# Patient Record
Sex: Female | Born: 1980 | Race: Black or African American | Hispanic: No | Marital: Single | Smoking: Current every day smoker
Health system: Southern US, Community
[De-identification: ages and names within clinical notes are randomized; demographics above are authoritative.]

## PROBLEM LIST (undated history)

## (undated) DIAGNOSIS — I10 Essential (primary) hypertension: Secondary | ICD-10-CM

---

## 2004-01-11 ENCOUNTER — Emergency Department (HOSPITAL_COMMUNITY): Admission: EM | Admit: 2004-01-11 | Discharge: 2004-01-11 | Payer: Self-pay | Admitting: Emergency Medicine

## 2006-04-23 ENCOUNTER — Emergency Department (HOSPITAL_COMMUNITY): Admission: EM | Admit: 2006-04-23 | Discharge: 2006-04-23 | Payer: Self-pay | Admitting: Emergency Medicine

## 2015-07-10 ENCOUNTER — Emergency Department (HOSPITAL_COMMUNITY)
Admission: EM | Admit: 2015-07-10 | Discharge: 2015-07-10 | Disposition: A | Discharge status: Home / Self Care | Payer: No Typology Code available for payment source | Attending: Emergency Medicine | Admitting: Emergency Medicine

## 2015-07-10 ENCOUNTER — Encounter (HOSPITAL_COMMUNITY): Payer: Self-pay

## 2015-07-10 DIAGNOSIS — S46811A Strain of other muscles, fascia and tendons at shoulder and upper arm level, right arm, initial encounter: Secondary | ICD-10-CM | POA: Insufficient documentation

## 2015-07-10 DIAGNOSIS — Y999 Unspecified external cause status: Secondary | ICD-10-CM | POA: Insufficient documentation

## 2015-07-10 DIAGNOSIS — Y929 Unspecified place or not applicable: Secondary | ICD-10-CM | POA: Diagnosis not present

## 2015-07-10 DIAGNOSIS — Y939 Activity, unspecified: Secondary | ICD-10-CM | POA: Diagnosis not present

## 2015-07-10 DIAGNOSIS — S4991XA Unspecified injury of right shoulder and upper arm, initial encounter: Secondary | ICD-10-CM | POA: Diagnosis present

## 2015-07-10 MED ORDER — DICLOFENAC SODIUM 75 MG PO TBEC
75.0000 mg | DELAYED_RELEASE_TABLET | Freq: Two times a day (BID) | ORAL | Status: DC
Start: 2015-07-10 — End: 2017-05-19

## 2015-07-10 MED ORDER — METHOCARBAMOL 500 MG PO TABS: 500.0000 mg | ORAL_TABLET | Freq: Three times a day (TID) | ORAL | Status: DC

## 2015-07-10 MED ORDER — ACETAMINOPHEN-CODEINE #3 300-30 MG PO TABS: 1.0000 | ORAL_TABLET | Freq: Four times a day (QID) | ORAL | Status: DC | PRN
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
## 2015-07-15 ENCOUNTER — Ambulatory Visit: Payer: Self-pay | Admitting: Orthopaedic Surgery

## 2015-07-16 ENCOUNTER — Encounter: Payer: Self-pay | Admitting: Orthopaedic Surgery

## 2015-09-25 ENCOUNTER — Emergency Department (HOSPITAL_COMMUNITY)
Admission: EM | Admit: 2015-09-25 | Discharge: 2015-09-25 | Disposition: A | Discharge status: Home / Self Care | Payer: Self-pay | Attending: Emergency Medicine | Admitting: Emergency Medicine

## 2015-09-25 ENCOUNTER — Encounter (HOSPITAL_COMMUNITY): Payer: Self-pay | Admitting: Emergency Medicine

## 2015-09-25 ENCOUNTER — Emergency Department (HOSPITAL_COMMUNITY): Payer: Self-pay

## 2015-09-25 DIAGNOSIS — M25572 Pain in left ankle and joints of left foot: Secondary | ICD-10-CM | POA: Insufficient documentation

## 2015-09-25 DIAGNOSIS — Z791 Long term (current) use of non-steroidal anti-inflammatories (NSAID): Secondary | ICD-10-CM | POA: Insufficient documentation

## 2015-09-25 MED ORDER — NAPROXEN 250 MG PO TABS: 250.0000 mg | ORAL_TABLET | Freq: Two times a day (BID) | ORAL | Status: DC | PRN

## 2015-09-25 NOTE — Discharge Instructions (Signed)
Take the prescription as directed.  Apply moist heat or ice to the area(s) of discomfort, for 15 minutes at a time, several times per day for the next few days.  Do not fall asleep on a heating or ice pack. Elevate your ankle and wear the ankle support until you are seen in follow up.  Call your regular medical doctor and the Orthopedic doctor today to schedule a follow up appointment next week.  Return to the Emergency Department immediately if worsening.

## 2015-09-25 NOTE — ED Provider Notes (Signed)
CSN: [id number]     Arrival date & time 09/25/15  1013 History   First MD Initiated Contact with Patient 09/25/15 1029     Chief Complaint  Patient presents with  . Joint Swelling    left ankle      HPI  Pt was seen at 1030. Per pt, c/o gradual onset and persistence of waxing and waning left ankle and LLE "swelling" for the past 3 months. Pt states the area "swells when I stand on it too long." Denies injury, no focal motor weakness, no tingling/numbness in extremities, no fevers, no rash, no other joints pain.   History reviewed. No pertinent past medical history.   History reviewed. No pertinent past surgical history.  Social History  Substance Use Topics  . Smoking status: Never Smoker   . Smokeless tobacco: None  . Alcohol Use: No    Review of Systems ROS: Statement: All systems negative except as marked or noted in the HPI; Constitutional: Negative for fever and chills. ; ; Eyes: Negative for eye pain, redness and discharge. ; ; ENMT: Negative for ear pain, hoarseness, nasal congestion, sinus pressure and sore throat. ; ; Cardiovascular: Negative for chest pain, palpitations, diaphoresis, dyspnea and peripheral edema. ; ; Respiratory: Negative for cough, wheezing and stridor. ; ; Gastrointestinal: Negative for nausea, vomiting, diarrhea, abdominal pain, blood in stool, hematemesis, jaundice and rectal bleeding. . ; ; Genitourinary: Negative for dysuria, flank pain and hematuria. ; ; Musculoskeletal: +left ankle swelling. Negative for back pain and neck pain. Negative for deformity and trauma.; ; Skin: Negative for pruritus, rash, abrasions, blisters, bruising and skin lesion.; ; Neuro: Negative for headache, lightheadedness and neck stiffness. Negative for weakness, altered level of consciousness, altered mental status, extremity weakness, paresthesias, involuntary movement, seizure and syncope.      Allergies  Review of patient's allergies indicates no known allergies.  Home  Medications   Prior to Admission medications   Medication Sig Start Date End Date Taking? Authorizing Provider  ibuprofen (ADVIL,MOTRIN) 200 MG tablet Take 800-1,000 mg by mouth every 6 (six) hours as needed for mild pain.   Yes Historical Provider, MD  acetaminophen-codeine (TYLENOL #3) 300-30 MG tablet Take 1-2 tablets by mouth every 6 (six) hours as needed for moderate pain. Patient not taking: Reported on 09/25/2015 07/10/15   Ivery Quale, PA-C  diclofenac (VOLTAREN) 75 MG EC tablet Take 1 tablet (75 mg total) by mouth 2 (two) times daily. Patient not taking: Reported on 09/25/2015 07/10/15   Ivery Quale, PA-C  methocarbamol (ROBAXIN) 500 MG tablet Take 1 tablet (500 mg total) by mouth 3 (three) times daily. Patient not taking: Reported on 09/25/2015 07/10/15   Ivery Quale, PA-C   Pulse 91  Temp(Src) 98.2 F (36.8 C) (Oral)  Resp 16  Ht  (1.549 m)  Wt 197 lb (89.359 kg)  BMI 37.24 kg/m2  SpO2 96%  LMP 09/11/2015 Physical Exam  1035: Physical examination:  Nursing notes reviewed; Vital signs and O2 SAT reviewed;  Constitutional: Well developed, Well nourished, Well hydrated, In no acute distress; Head:  Normocephalic, atraumatic; Eyes: EOMI, PERRL, No scleral icterus; ENMT: Mouth and pharynx normal, Mucous membranes moist; Neck: Supple, Full range of motion, No lymphadenopathy; Cardiovascular: Regular rate and rhythm, No murmur, rub, or gallop; Respiratory: Breath sounds clear & equal bilaterally, No rales, rhonchi, wheezes.  Speaking full sentences with ease, Normal respiratory effort/excursion; Chest: Nontender, Movement normal; Abdomen: Soft, Nontender, Nondistended, Normal bowel sounds; Genitourinary: No CVA tenderness; Extremities: Pulses normal,  NT left knee/ankle/foot. NMS intact left foot, strong pedal pp, LE muscle compartments soft.  No left proximal fibular head tenderness.  No deformity, no ecchymosis, no edema, no open wounds.  +plantarflexion of left foot w/calf squeeze.   No palpable gap left. Achilles's tendon. No calf tenderness, edema or asymmetry.; Neuro: AA&Ox3, Major CN grossly intact.  Speech clear. No gross focal motor or sensory deficits in extremities.; Skin: Color normal, Warm, Dry.   ED Course  Procedures (including critical care time) Labs Review  Imaging Review  I have personally reviewed and evaluated these images and lab results as part of my medical decision-making.   EKG Interpretation None      MDM  MDM Reviewed: previous chart, nursing note and vitals Interpretation: ultrasound and x-ray     Dg Ankle Complete Left 09/25/2015  CLINICAL DATA:  Pain and swelling for 3 months EXAM: LEFT ANKLE COMPLETE - 3+ VIEW COMPARISON:  None. FINDINGS: Frontal, oblique, and lateral views were obtained. There is generalized soft tissue swelling. There is no fracture or joint effusion. The ankle mortise appears intact. No erosive change. IMPRESSION: Generalized soft tissue swelling. No fracture or appear arthropathy. Ankle mortise appears intact. Electronically Signed   By: Bretta BangWilliam  Woodruff III M.D.   On: 09/25/2015 11:17   [place] Venous Img Lower Unilateral Left 09/25/2015  CLINICAL DATA:  Left ankle pain and swelling for the past 3 months. Evaluate for DVT. EXAM: LEFT LOWER EXTREMITY VENOUS DOPPLER ULTRASOUND TECHNIQUE: Gray-scale sonography with graded compression, as well as color Doppler and duplex ultrasound were performed to evaluate the lower extremity deep venous systems from the level of the common femoral vein and including the common femoral, femoral, profunda femoral, popliteal and calf veins including the posterior tibial, peroneal and gastrocnemius veins when visible. The superficial great saphenous vein was also interrogated. Spectral Doppler was utilized to evaluate flow at rest and with distal augmentation maneuvers in the common femoral, femoral and popliteal veins. COMPARISON:  None. FINDINGS: Contralateral Common Femoral Vein: Respiratory  phasicity is normal and symmetric with the symptomatic side. No evidence of thrombus. Normal compressibility. Common Femoral Vein: No evidence of thrombus. Normal compressibility, respiratory phasicity and response to augmentation. Saphenofemoral Junction: No evidence of thrombus. Normal compressibility and flow on color Doppler imaging. Profunda Femoral Vein: No evidence of thrombus. Normal compressibility and flow on color Doppler imaging. Femoral Vein: No evidence of thrombus. Normal compressibility, respiratory phasicity and response to augmentation. Popliteal Vein: No evidence of thrombus. Normal compressibility, respiratory phasicity and response to augmentation. Calf Veins: No evidence of thrombus. Normal compressibility and flow on color Doppler imaging. Superficial Great Saphenous Vein: No evidence of thrombus. Normal compressibility and flow on color Doppler imaging. Venous Reflux:  None. Other Findings:  None. IMPRESSION: No evidence of DVT within the left lower extremity. Electronically Signed   By: Simonne ComeJohn  Watts M.D.   On: 09/25/2015 11:46    1225:  Vasc US and XR reassuring. Tx symptomatically, f/u Ortho MD. Dx and testing d/w pt.  Questions answered.  Verb understanding, agreeable to d/c home with outpt f/u.   Samuel JesterKathleen Lavern Maslow, DO 09/27/15 [phone]

## 2015-09-25 NOTE — ED Notes (Signed)
Swelling to left ankle for last 3 months.  Rates pain 8/10. Denies injury.

## 2017-02-17 IMAGING — US US EXTREM LOW VENOUS*L*
1 series · 13 of 24 positions shown · non-contrast
Comparison: None.

CLINICAL DATA: Left ankle pain and swelling for the past 3 months.
Evaluate for DVT.



[Series 1: us extrem low venous*left* · 0.08mm/px · 13 of 33 slices shown]
[im 1/33]
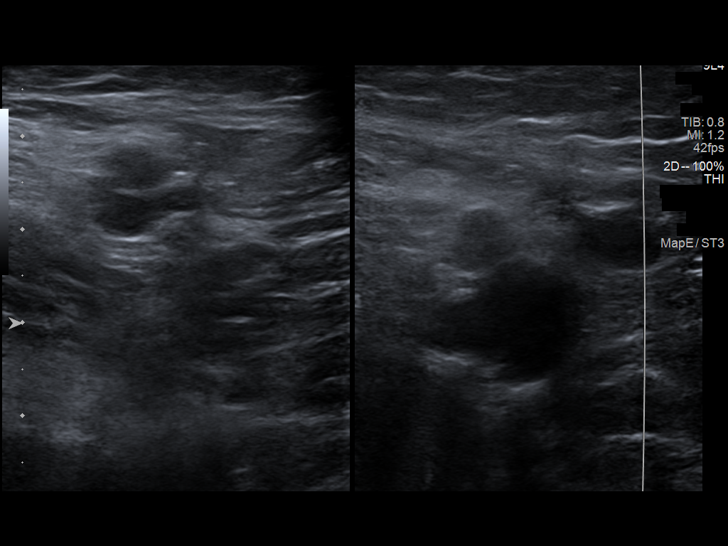
[im 3/33]
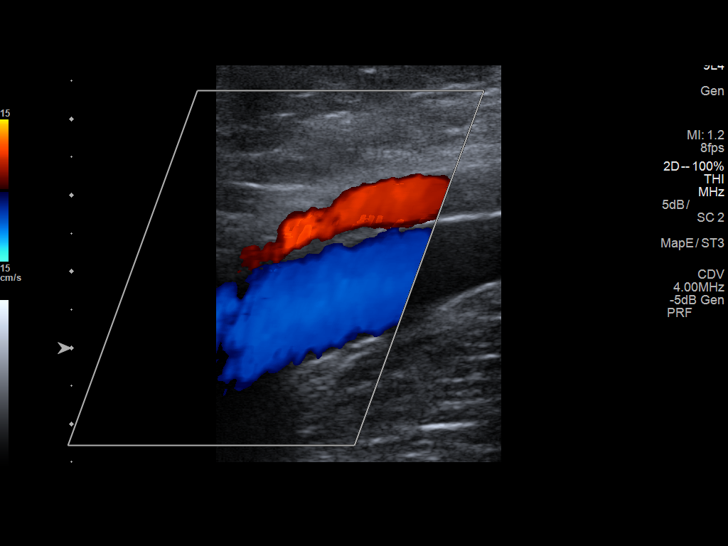
[im 6/33]
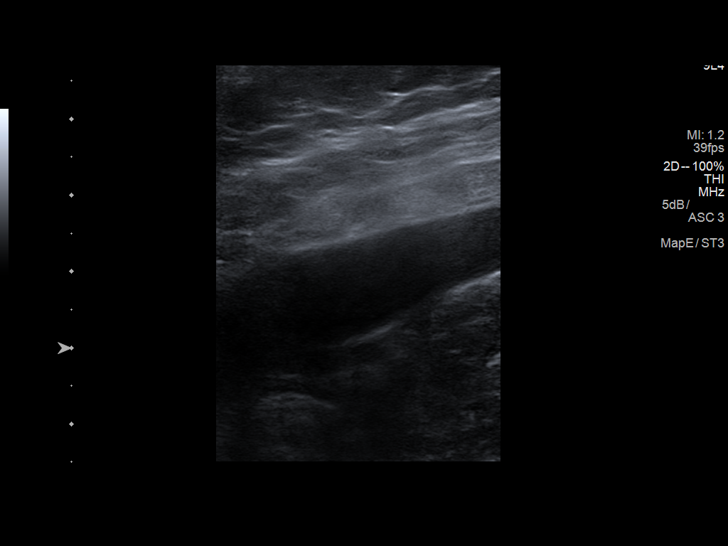
[im 9/33]
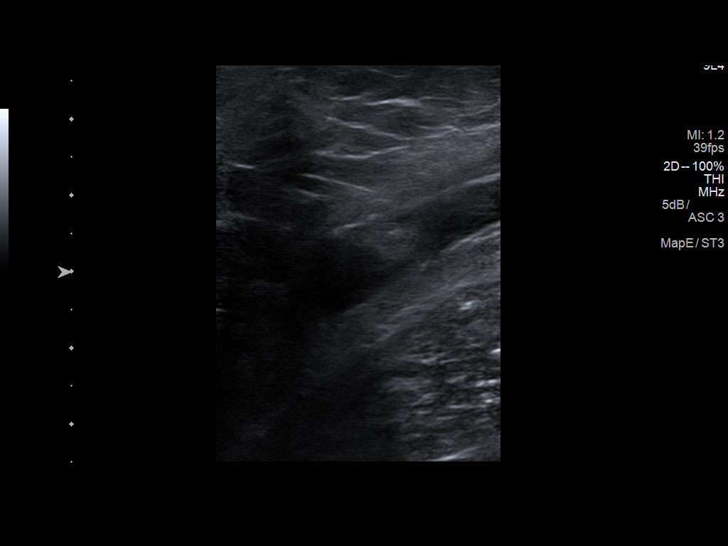
[im 12/33]
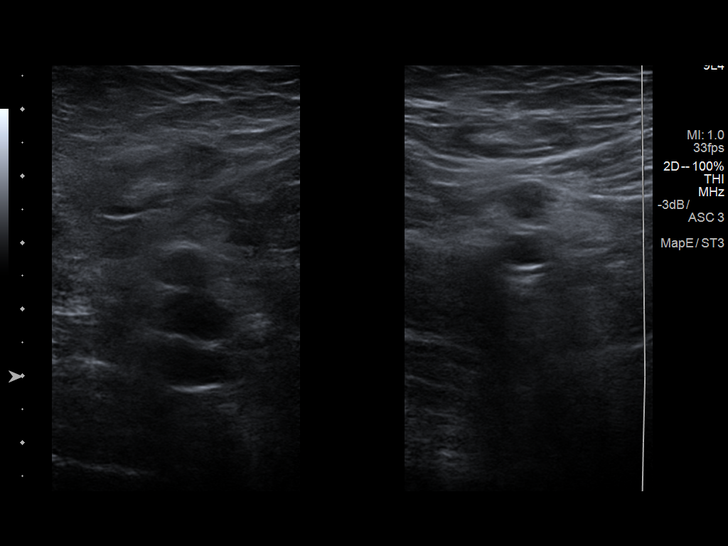
[im 14/33]
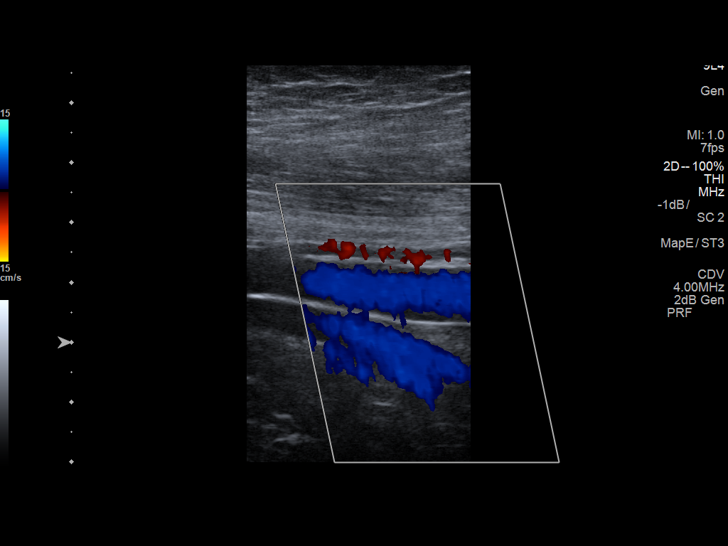
[im 17/33]
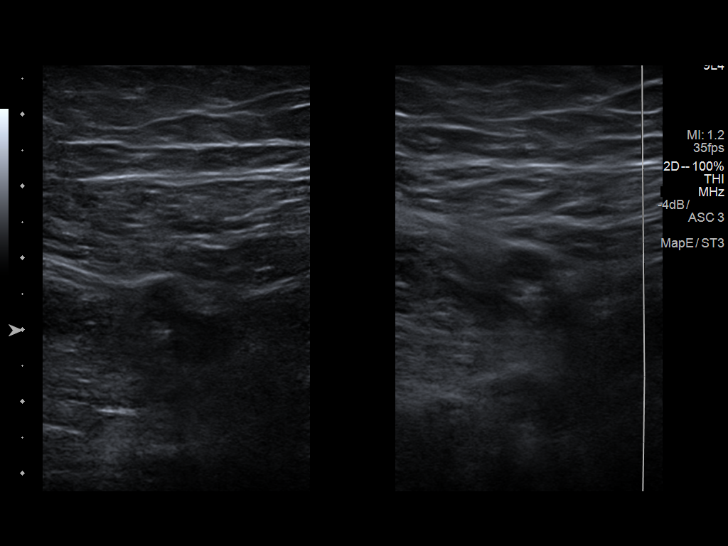
[im 19/33]
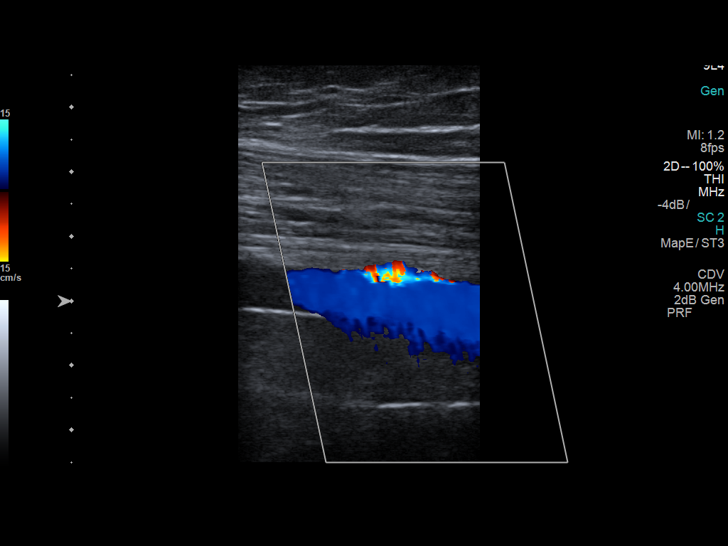
[im 21/33]
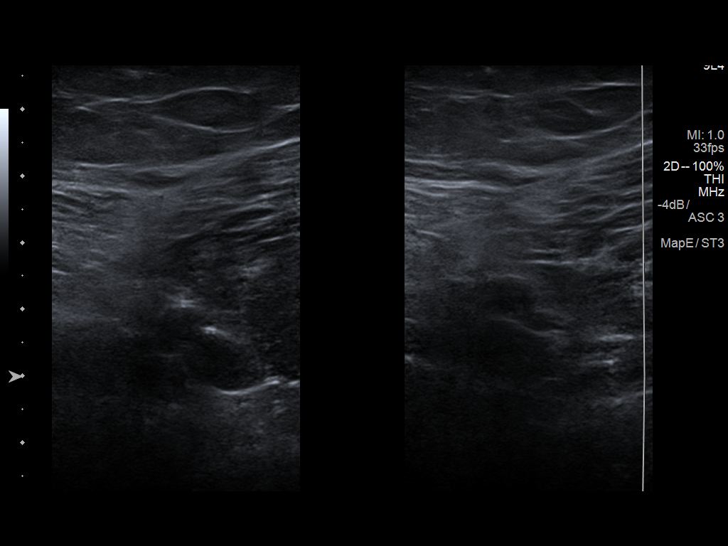
[im 24/33]
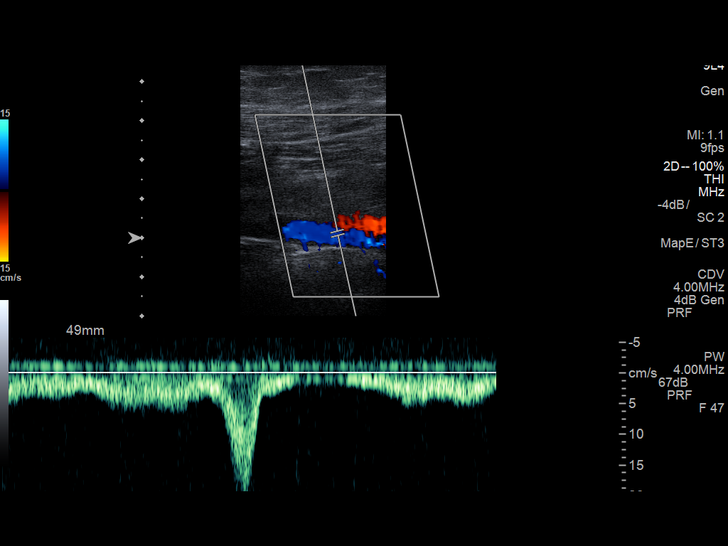
[im 27/33]
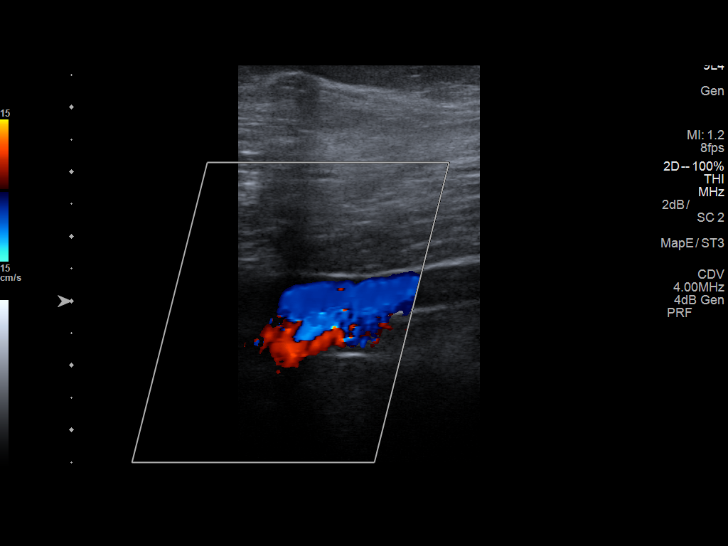
[im 30/33]
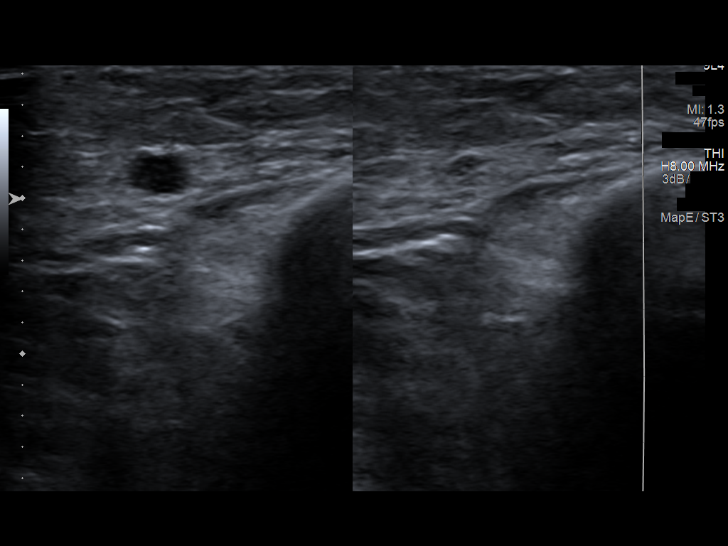
[im 33/33]
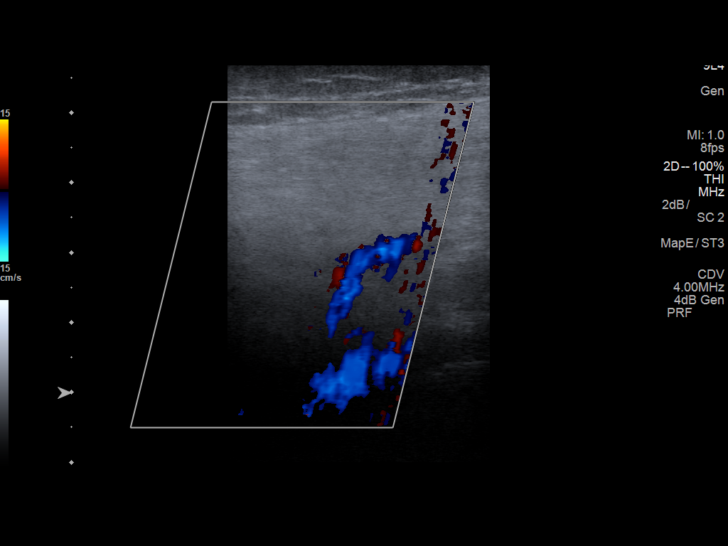

[13 of 24 positions shown; findings below may reference images not displayed]

FINDINGS: Contralateral Common Femoral Vein: Respiratory phasicity is normal
and symmetric with the symptomatic side. No evidence of thrombus.
Normal compressibility.

Common Femoral Vein: No evidence of thrombus. Normal
compressibility, respiratory phasicity and response to augmentation.

Saphenofemoral Junction: No evidence of thrombus. Normal
compressibility and flow on color Doppler imaging.

Profunda Femoral Vein: No evidence of thrombus. Normal
compressibility and flow on color Doppler imaging.

Femoral Vein: No evidence of thrombus. Normal compressibility,
respiratory phasicity and response to augmentation.

Popliteal Vein: No evidence of thrombus. Normal compressibility,
respiratory phasicity and response to augmentation.

Calf Veins: No evidence of thrombus. Normal compressibility and flow
on color Doppler imaging.

Superficial Great Saphenous Vein: No evidence of thrombus. Normal
compressibility and flow on color Doppler imaging.

Venous Reflux:  None.

Other Findings:  None.
IMPRESSION: No evidence of DVT within the left lower extremity.

## 2017-05-19 ENCOUNTER — Emergency Department (HOSPITAL_COMMUNITY)
Admission: EM | Admit: 2017-05-19 | Discharge: 2017-05-19 | Disposition: A | Discharge status: Home / Self Care | Payer: Self-pay | Attending: Emergency Medicine | Admitting: Emergency Medicine

## 2017-05-19 ENCOUNTER — Encounter (HOSPITAL_COMMUNITY): Payer: Self-pay | Admitting: Emergency Medicine

## 2017-05-19 ENCOUNTER — Emergency Department (HOSPITAL_COMMUNITY): Payer: Self-pay

## 2017-05-19 ENCOUNTER — Other Ambulatory Visit: Payer: Self-pay

## 2017-05-19 DIAGNOSIS — I10 Essential (primary) hypertension: Secondary | ICD-10-CM | POA: Insufficient documentation

## 2017-05-19 DIAGNOSIS — F17200 Nicotine dependence, unspecified, uncomplicated: Secondary | ICD-10-CM | POA: Insufficient documentation

## 2017-05-19 DIAGNOSIS — R0789 Other chest pain: Secondary | ICD-10-CM | POA: Insufficient documentation

## 2017-05-19 DIAGNOSIS — Z79899 Other long term (current) drug therapy: Secondary | ICD-10-CM | POA: Insufficient documentation

## 2017-05-19 HISTORY — DX: Essential (primary) hypertension: I10

## 2017-05-19 LAB — BASIC METABOLIC PANEL
Anion gap: 9 (ref 5–15)
BUN: 7 mg/dL (ref 6–20)
CHLORIDE: 104 mmol/L (ref 101–111)
CO2: 24 mmol/L (ref 22–32)
Calcium: 9.4 mg/dL (ref 8.9–10.3)
Creatinine, Ser: 0.54 mg/dL (ref 0.44–1.00)
GFR calc Af Amer: >60 mL/min (ref >60)
GFR calc non Af Amer: >60 mL/min (ref >60)
GLUCOSE: 115 mg/dL — AB (ref 65–99)
POTASSIUM: 3.6 mmol/L (ref 3.5–5.1)
Sodium: 137 mmol/L (ref 135–145)

## 2017-05-19 LAB — CBC
HEMATOCRIT: 34.8 % — AB (ref 36.0–46.0)
Hemoglobin: 11.7 g/dL — ABNORMAL LOW (ref 12.0–15.0)
MCH: 23.6 pg — AB (ref 26.0–34.0)
MCHC: 33.6 g/dL (ref 30.0–36.0)
MCV: 70.2 fL — AB (ref 78.0–100.0)
Platelets: 415 10*3/uL — ABNORMAL HIGH (ref 150–400)
RBC: 4.96 MIL/uL (ref 3.87–5.11)
RDW: 15.5 % (ref 11.5–15.5)
WBC: 10 10*3/uL (ref 4.0–10.5)

## 2017-05-19 LAB — TSH: TSH: 2.948 u[IU]/mL (ref 0.350–4.500)

## 2017-05-19 LAB — PROTIME-INR
INR: 0.89
Prothrombin Time: 12 seconds (ref 11.4–15.2)

## 2017-05-19 LAB — TROPONIN I: Troponin I: <0.03 ng/mL (ref <0.03)

## 2017-05-19 LAB — I-STAT BETA HCG BLOOD, ED (MC, WL, AP ONLY): I-stat hCG, quantitative: <5 m[IU]/mL (ref <5)

## 2017-05-19 LAB — CBG MONITORING, ED: GLUCOSE-CAPILLARY: 120 mg/dL — AB (ref 65–99)

## 2017-05-19 LAB — APTT: aPTT: 27 seconds (ref 24–36)

## 2017-05-19 MED ORDER — ASPIRIN 81 MG PO CHEW
324.0000 mg | CHEWABLE_TABLET | Freq: Once | ORAL | Status: AC
  Administered 2017-05-19: 324 mg via ORAL
  Filled 2017-05-19: qty 4

## 2017-05-19 MED ORDER — NITROGLYCERIN 0.4 MG SL SUBL
0.4000 mg | SUBLINGUAL_TABLET | SUBLINGUAL | Status: DC | PRN
  Administered 2017-05-19 (×2): 0.4 mg via SUBLINGUAL
  Filled 2017-05-19: qty 1

## 2017-05-19 NOTE — ED Triage Notes (Signed)
Pt C/O chest pain and SOB that started 1 week ago.

## 2017-05-19 NOTE — ED Provider Notes (Signed)
[facility] EMERGENCY DEPARTMENT Provider Note   CSN: [id number] Arrival date & time: 05/19/17  1928     History   Chief Complaint Chief Complaint  Patient presents with  . Chest Pain    HPI Adriana Russell is a 37 y.o. female.  HPI   The patient is a 37 year old female, she is known to have high blood pressure and has had high blood pressure for several years.  She was on lisinopril until 1 year ago when she stopped taking it on her own volition.  She reports that over the last week she has had some progressive chest pain which she describes as a heaviness on her chest when she is moving around including walking.  This chest pain is like someone is sitting on her chest, left-sided, no radiation, no associated nausea or diaphoresis.  She does get short of breath with it.  It lasts for approximately 1 hour and then resolves as she rests.  She denies any swelling of the legs.  She went to the health department yesterday because of the chest pain, she was told that she had "fluid around her heart" and that if she had more chest pain she needed to come back to the hospital.  The patient was restarted on her lisinopril and has had 2 doses.  She reports that today the chest pain became more intense, this prompted her visit to the emergency department.  She has active ongoing left-sided mild heaviness in the chest with some shortness of breath.  No fevers, no coughing, no other gastrointestinal symptoms.  She does smoke cigarettes, she denies any significant alcohol use, denies any cocaine use.  Past Medical History:  Diagnosis Date  . Hypertension     There are no active problems to display for this patient.   History reviewed. No pertinent surgical history.  OB History    No data available       Home Medications    Prior to Admission medications   Medication Sig Start Date End Date Taking? Authorizing Provider  lisinopril-hydrochlorothiazide (PRINZIDE,ZESTORETIC) 20-12.5 MG  tablet Take 1 tablet by mouth daily.   Yes [provider]  Vitamin D, Ergocalciferol, (DRISDOL) 50000 units CAPS capsule Take 50,000 Units by mouth every 7 (seven) days.   Yes [provider]    Family History No family history on file.  Social History Social History   Tobacco Use  . Smoking status: Current Every Day Smoker    Packs/day: 1.00  . Smokeless tobacco: Never Used  Substance Use Topics  . Alcohol use: No  . Drug use: No     Allergies   Patient has no known allergies.   Review of Systems Review of Systems  All other systems reviewed and are negative.    Physical Exam Updated Vital Signs BP 130/82   Pulse 95   Temp 99.1 F (37.3 C)   Resp (!) 21   Wt 89.4 kg (197 lb)   LMP 05/01/2017 (Approximate)   SpO2 99%   BMI 37.22 kg/m   Physical Exam  Constitutional: She appears well-developed and well-nourished. No distress.  HENT:  Head: Normocephalic and atraumatic.  Mouth/Throat: Oropharynx is clear and moist. No oropharyngeal exudate.  Eyes: Conjunctivae and EOM are normal. Pupils are equal, round, and reactive to light. Right eye exhibits no discharge. Left eye exhibits no discharge. No scleral icterus.  Neck: Normal range of motion. Neck supple. No JVD present. Thyromegaly present.  Palpable nontender thyroid  Cardiovascular: Normal  rate, regular rhythm, normal heart sounds and intact distal pulses. Exam reveals no gallop and no friction rub.  No murmur heard. No murmurs, no JVD, no edema  Pulmonary/Chest: Effort normal and breath sounds normal. No respiratory distress. She has no wheezes. She has no rales.  The patient speaks in full sentences, has no increased work of breathing and has clear lung sounds without rales wheezing or rhonchi  Abdominal: Soft. Bowel sounds are normal. She exhibits no distension and no mass. There is no tenderness.  Musculoskeletal: Normal range of motion. She exhibits no edema or tenderness.  No edema of  the lower extremities  Lymphadenopathy:    She has no cervical adenopathy.  Neurological: She is alert. Coordination normal.  Skin: Skin is warm and dry. No rash noted. No erythema.  Psychiatric: She has a normal mood and affect. Her behavior is normal.  Nursing note and vitals reviewed.    ED Treatments / Results  Labs (all labs ordered are listed, but only abnormal results are displayed) Labs Reviewed  BASIC METABOLIC PANEL - Abnormal; Notable for the following components:      Result Value   Glucose, Bld 115 (*)    All other components within normal limits  CBC - Abnormal; Notable for the following components:   Hemoglobin 11.7 (*)    HCT 34.8 (*)    MCV 70.2 (*)    MCH 23.6 (*)    Platelets 415 (*)    All other components within normal limits  CBG MONITORING, ED - Abnormal; Notable for the following components:   Glucose-Capillary 120 (*)    All other components within normal limits  APTT  PROTIME-INR  TROPONIN I  TSH  TROPONIN I  I-STAT BETA HCG BLOOD, ED ([facility], [facility], [facility] ONLY)    EKG  EKG Interpretation  Date/Time:  Friday May 19 2017 19:45:30 EST Ventricular Rate:  103 PR Interval:    QRS Duration: 80 QT Interval:  325 QTC Calculation: 426 R Axis:   27 Text Interpretation:  Sinus tachycardia normal ST waves and normal T waves no old EKG Confirmed by Eber HongMiller, Teliyah Royal ([id number]) on 05/19/2017 7:50:37 PM       Radiology Dg Chest Portable 1 View  Result Date: 05/19/2017 CLINICAL DATA:  Chest pain and short of breath EXAM: PORTABLE CHEST 1 VIEW COMPARISON:  None. FINDINGS: Borderline cardiomegaly. No consolidation or effusion. No pneumothorax. IMPRESSION: No active disease. Electronically Signed   By: Jasmine PangKim  Fujinaga M.D.   On: 05/19/2017 20:09    Procedures Procedures (including critical care time)  Medications Ordered in ED Medications  nitroGLYCERIN (NITROSTAT) SL tablet 0.4 mg (0.4 mg Sublingual Given 05/19/17 1958)  aspirin chewable tablet 324 mg (324 mg Oral  Given 05/19/17 1952)     Initial Impression / Assessment and Plan / ED Course  I have reviewed the triage vital signs and the nursing notes.  Pertinent labs & imaging results that were available during my care of the patient were reviewed by me and considered in my medical decision making (see chart for details).  Clinical Course as of May 20 2318  Caleen EssexFri May 19, 2017  2111 In the course of the evaluation - the trop was neg, the labwork was overall unremarkable and the EKG shows no acute ST elevations or depression and the CXR is without edema or consolidation  [BM]  2112 BP measurements have normalized - she had ASA and nitro and now feels back to normal - now states that she only  has CP when she takes a deep breath - no CP at rest.  [BM]  2112 The HEART Pathway score is 3 - she is considered low risk enough that 2 trop neg and she can be d/c to f/u in outpatient setting.  Pt is agreeable.  [BM]    Clinical Course User Index [BM] Eber Hong, MD   The patient's exam is unremarkable however her blood pressure is 190/125, her symptoms are concerning for an exertional dyspnea which may be blood pressure related but could also be ischemic.  EKG will be ordered, labs ordered, the patient will be given aspirin.   Chest pain-free, second troponin is negative, patient well-appearing and stable for discharge, message sent to cardiologist to expedite follow-up, appreciate their help, patient aware of instructions for follow-up.  Final Clinical Impressions(s) / ED Diagnoses   Final diagnoses:  Intermittent left-sided chest pain    ED Discharge Orders    None       Eber Hong, MD 05/19/17 2321

## 2017-05-19 NOTE — Discharge Instructions (Signed)
Your testing here shows that you are not having a heart attack Your blood pressure has normalized and looks much better Please stay out of work until you have seen the cardiologist early next week I have sent the cardiologist on call and message and I am asking you to make a phone call on Monday morning to go to the heart clinic.  They should see you early next week If you should develop severe or worsening pain in her chest or difficulty breathing or any pain that radiates to your arm or your jaw or your neck or your back, come back to the emergency department immediately. Please start taking a baby aspirin once a day Take your lisinopril daily

## 2017-06-15 ENCOUNTER — Ambulatory Visit: Payer: Self-pay | Admitting: Cardiology

## 2017-07-10 IMAGING — DX DG ANKLE COMPLETE 3+V*L*
3 series · 3 of 3 positions shown · non-contrast
Comparison: None.

CLINICAL DATA: Pain and swelling for 3 months

EXAM:
LEFT ANKLE COMPLETE - 3+ VIEW

[ankle ap]
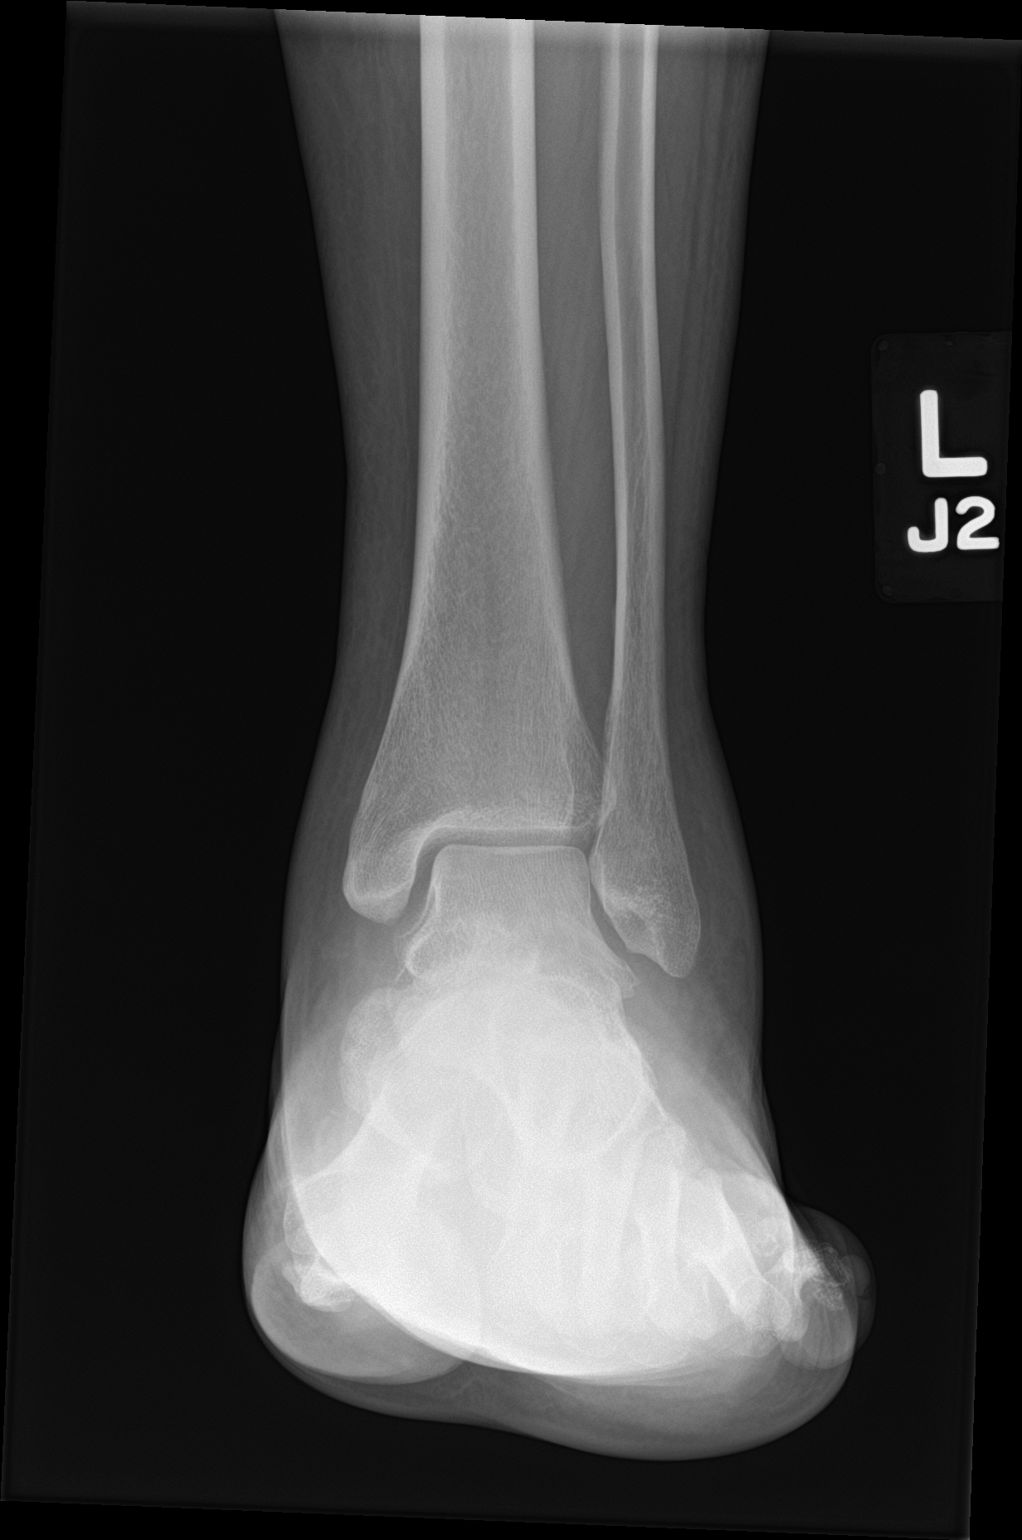

[ankle obl]
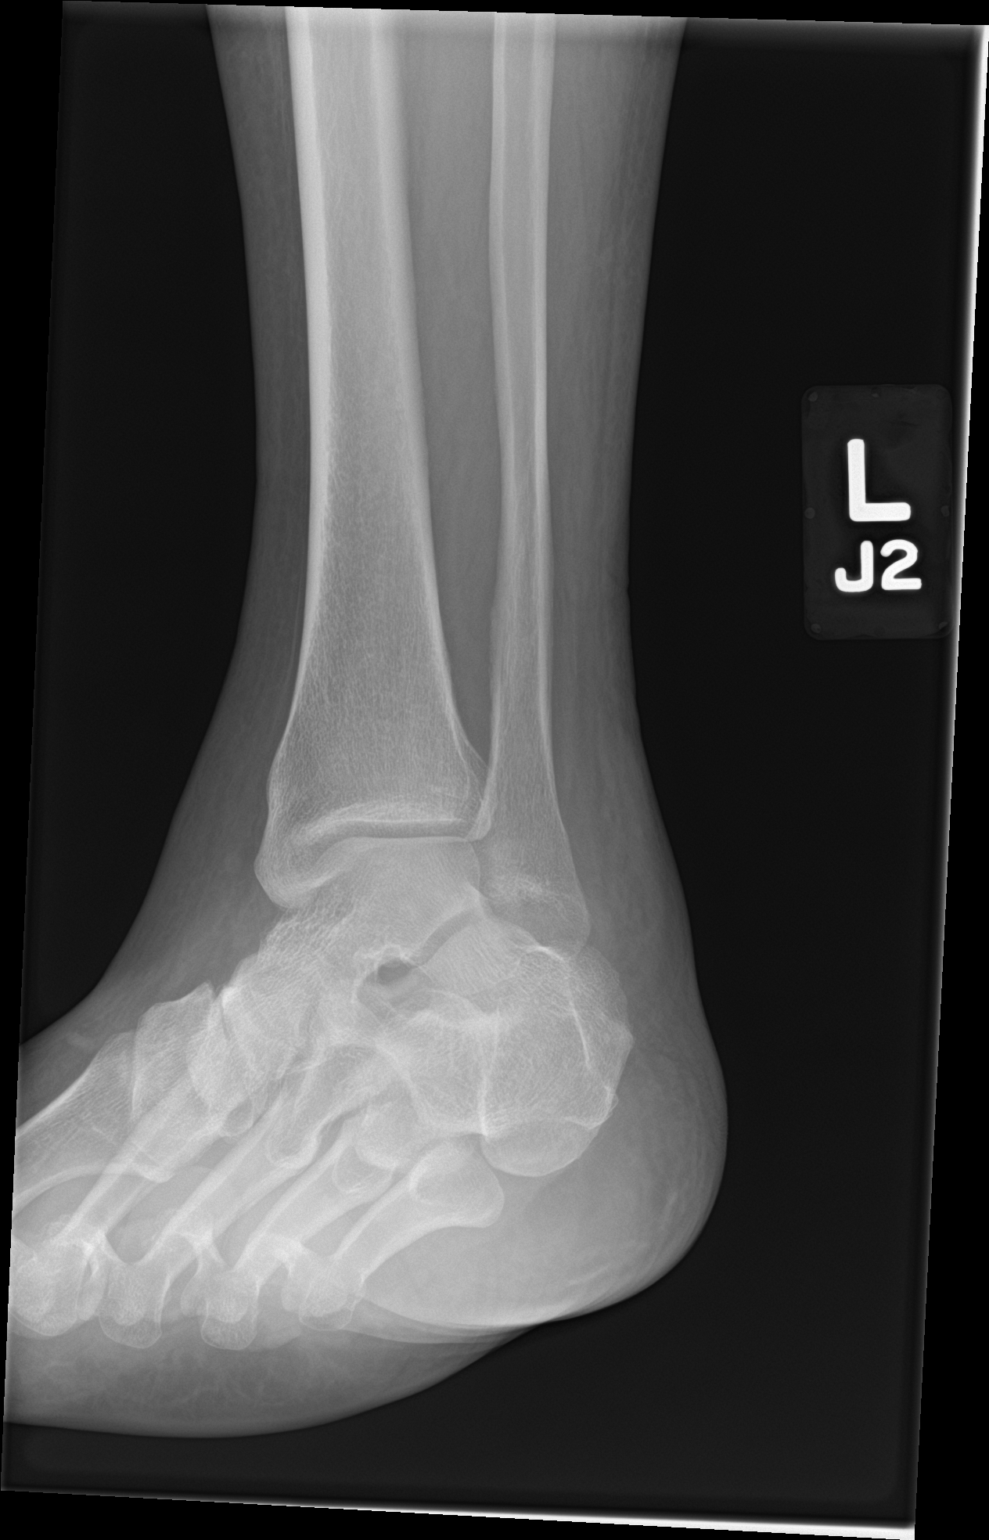

[ankle lat]
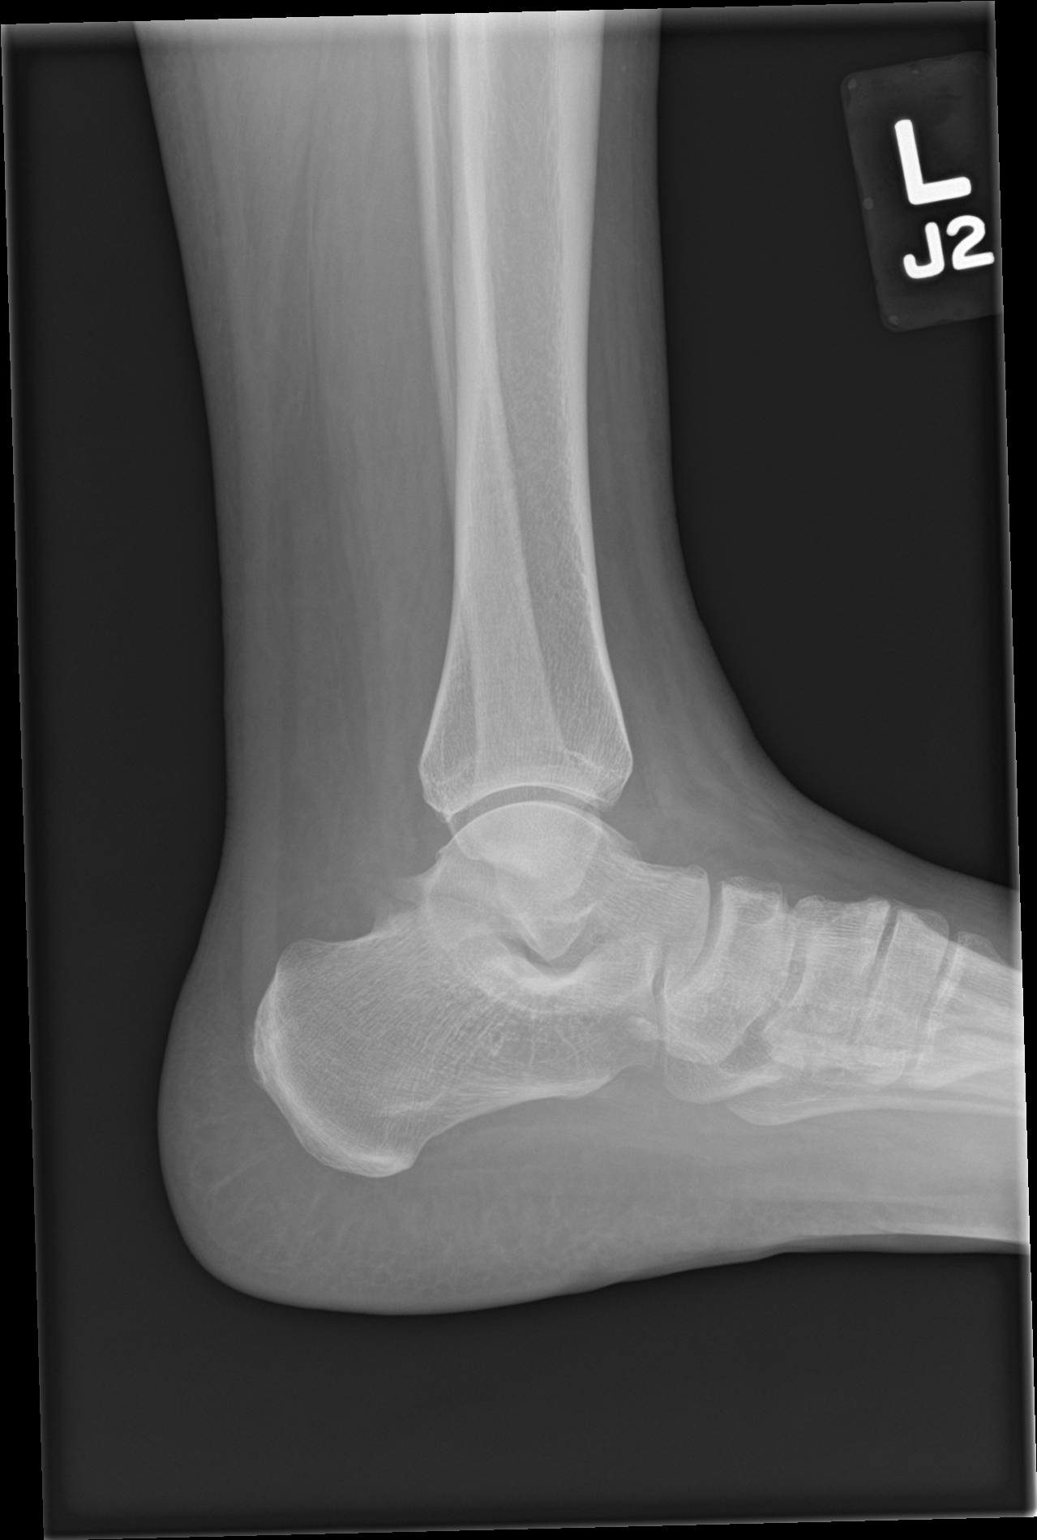

[3 of 3 positions shown; findings below may reference images not displayed]

FINDINGS: Frontal, oblique, and lateral views were obtained. There is
generalized soft tissue swelling. There is no fracture or joint
effusion. The ankle mortise appears intact. No erosive change.
IMPRESSION: Generalized soft tissue swelling. No fracture or appear arthropathy.
Ankle mortise appears intact.

## 2019-03-04 IMAGING — CR DG CHEST 1V PORT
1 series · 1 of 1 positions shown · non-contrast
Comparison: None.

CLINICAL DATA: Chest pain and short of breath

EXAM:
PORTABLE CHEST 1 VIEW

[portable]
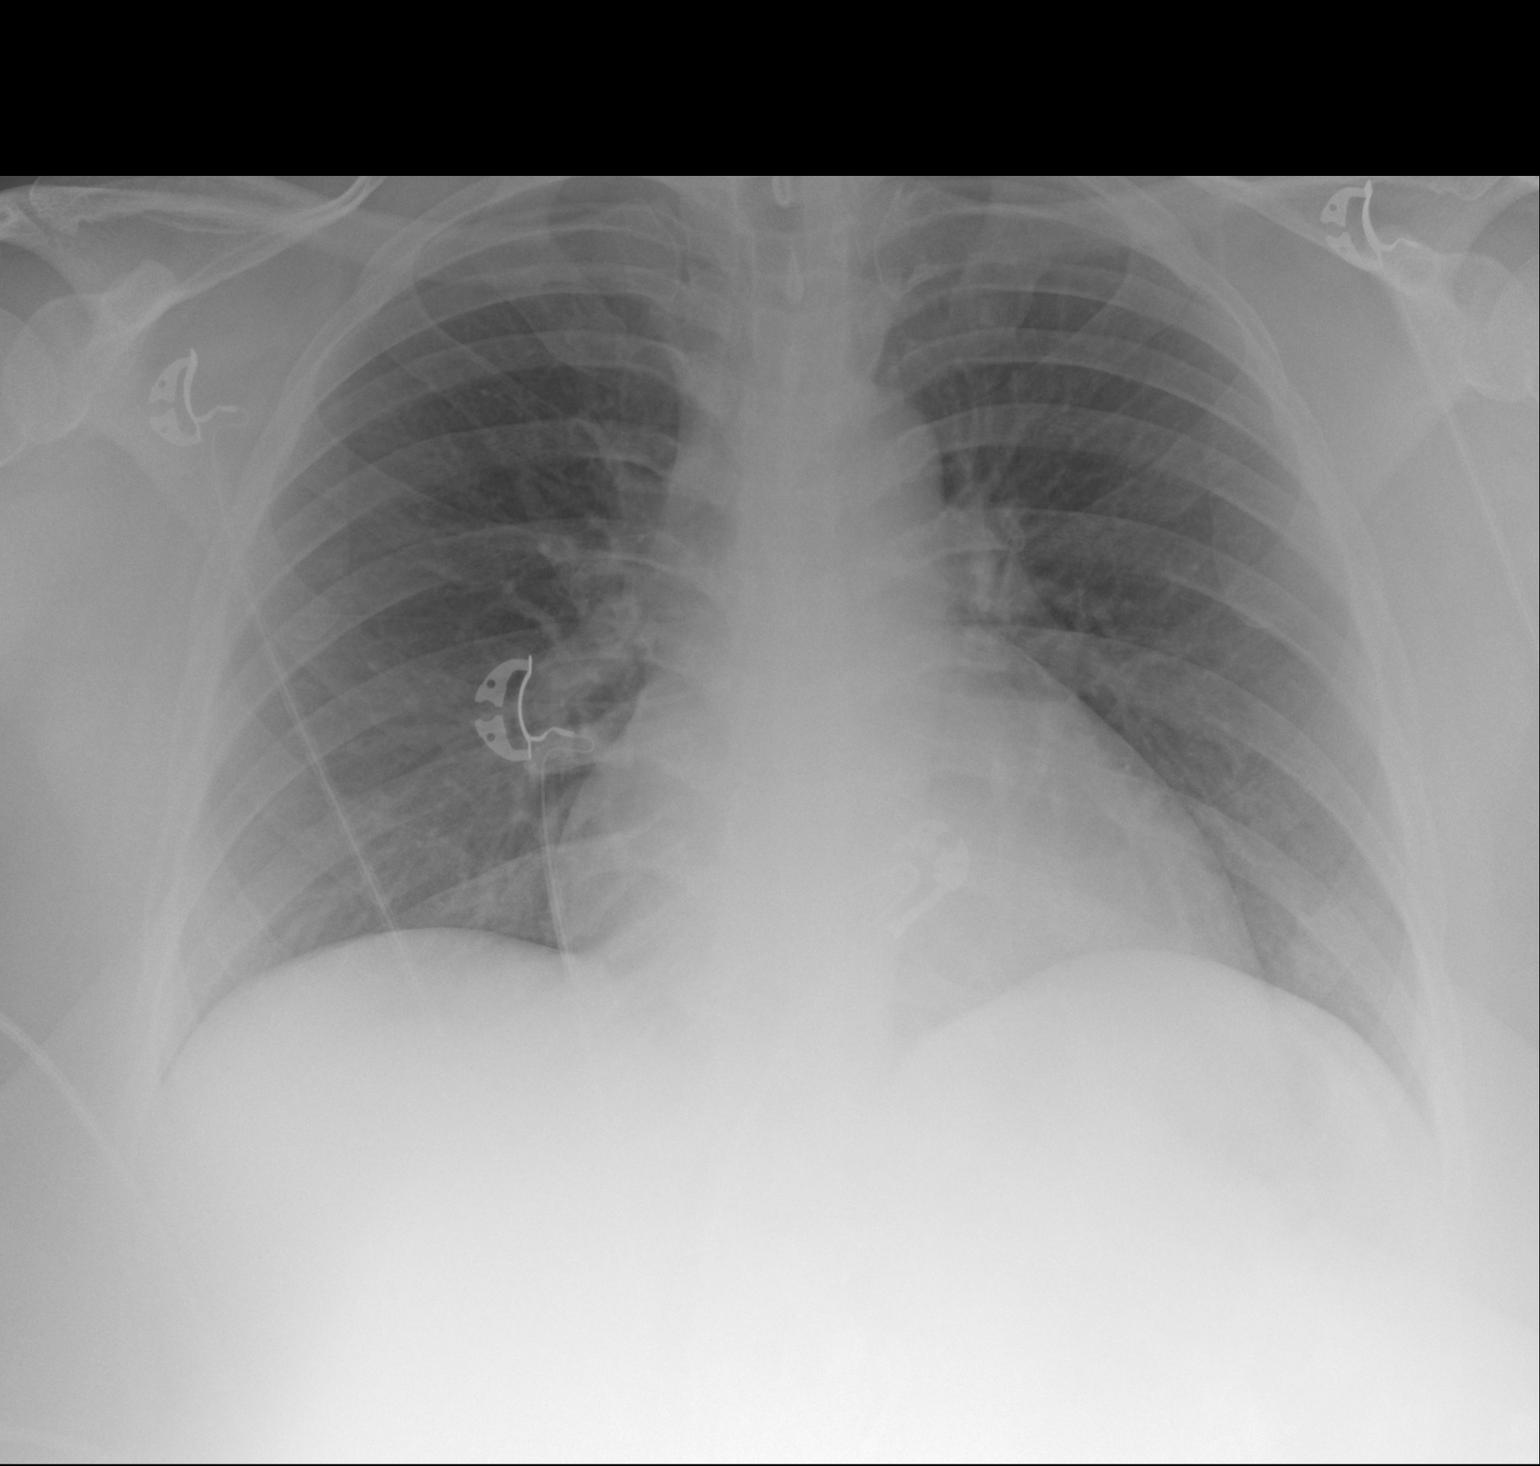

[1 of 1 positions shown; findings below may reference images not displayed]

FINDINGS: Borderline cardiomegaly. No consolidation or effusion. No
pneumothorax.
IMPRESSION: No active disease.

## 2020-05-17 ENCOUNTER — Encounter (HOSPITAL_COMMUNITY): Payer: Self-pay

## 2020-05-17 ENCOUNTER — Other Ambulatory Visit: Payer: Self-pay

## 2020-05-17 ENCOUNTER — Emergency Department (HOSPITAL_COMMUNITY)
Admission: EM | Admit: 2020-05-17 | Discharge: 2020-05-17 | Disposition: A | Discharge status: Home / Self Care | Payer: Self-pay | Attending: Emergency Medicine | Admitting: Emergency Medicine

## 2020-05-17 DIAGNOSIS — F17210 Nicotine dependence, cigarettes, uncomplicated: Secondary | ICD-10-CM | POA: Insufficient documentation

## 2020-05-17 DIAGNOSIS — Z79899 Other long term (current) drug therapy: Secondary | ICD-10-CM | POA: Insufficient documentation

## 2020-05-17 DIAGNOSIS — I10 Essential (primary) hypertension: Secondary | ICD-10-CM | POA: Insufficient documentation

## 2020-05-17 DIAGNOSIS — L0231 Cutaneous abscess of buttock: Secondary | ICD-10-CM | POA: Insufficient documentation

## 2020-05-17 MED ORDER — DOXYCYCLINE HYCLATE 100 MG PO CAPS: 100.0000 mg | ORAL_CAPSULE | Freq: Two times a day (BID) | ORAL | 0 refills | Status: AC

## 2020-05-17 MED ORDER — KETOROLAC TROMETHAMINE 60 MG/2ML IM SOLN
20.0000 mg | Freq: Once | INTRAMUSCULAR | Status: AC
  Administered 2020-05-17: 20 mg via INTRAMUSCULAR
  Filled 2020-05-17: qty 2

## 2020-05-17 NOTE — ED Provider Notes (Signed)
[facility] EMERGENCY DEPARTMENT Provider Note   CSN: [id number] Arrival date & time: 05/17/20  1630     History Chief Complaint  Patient presents with  . Abscess    Adriana Russell is a 40 y.o. female.  HPI    Patient with no significant medical history presents to the emergency department with chief complaint of abscess on her buttocks.  Patient endorses she noted the abscess approximately 3 days ago, states it has gotten larger and become more painful.  She denies drainage or discharge, she has been placing warm compresses on it without  relief.  She endorses that she has had abscesses in the past but generally they are on her arms, and will drain on their own.  She has not had one in a couple of years.  She denies  recent traumas to the area, denies bug bites or itching the area.  She is not immunocompromise and denies any alleviating factors.  Patient denies headaches, fevers, chills, shortness of breath, chest pain, abdominal pain, nausea, vomiting, diarrhea, worsening pedal edema.  Past Medical History:  Diagnosis Date  . Hypertension     There are no problems to display for this patient.   Past Surgical History:  Procedure Laterality Date  . CESAREAN SECTION     4     OB History   No obstetric history on file.     History reviewed. No pertinent family history.  Social History   Tobacco Use  . Smoking status: Current Every Day Smoker    Packs/day: 0.50    Types: Cigarettes  . Smokeless tobacco: Never Used  Vaping Use  . Vaping Use: Never used  Substance Use Topics  . Alcohol use: No  . Drug use: No    Home Medications Prior to Admission medications   Medication Sig Start Date End Date Taking? Authorizing Provider  doxycycline (VIBRAMYCIN) 100 MG capsule Take 1 capsule (100 mg total) by mouth 2 (two) times daily for 7 days. 05/17/20 05/24/20 Yes Carroll Sage, PA-C  lisinopril-hydrochlorothiazide (PRINZIDE,ZESTORETIC) 20-12.5 MG tablet Take 1  tablet by mouth daily.    [provider]  Vitamin D, Ergocalciferol, (DRISDOL) 50000 units CAPS capsule Take 50,000 Units by mouth every 7 (seven) days.    [provider]    Allergies    Patient has no known allergies.  Review of Systems   Review of Systems  Constitutional: Negative for chills and fever.  HENT: Negative for congestion.   Respiratory: Negative for shortness of breath.   Cardiovascular: Negative for chest pain.  Gastrointestinal: Negative for abdominal pain.  Genitourinary: Negative for enuresis.  Musculoskeletal: Negative for back pain.  Skin: Negative for rash.       Abscess on her right buttocks  Neurological: Negative for dizziness.    Physical Exam Updated Vital Signs BP (!) 148/85   Pulse 95   Temp 98.5 F (36.9 C) (Oral)   Resp 17   Ht 5\' 1"  (1.549 m)   Wt 88.5 kg   SpO2 99%   BMI 36.84 kg/m   Physical Exam Vitals and nursing note reviewed. Exam conducted with a chaperone present.  Constitutional:      General: She is not in acute distress.    Appearance: Normal appearance. She is not ill-appearing or diaphoretic.  HENT:     Head: Normocephalic and atraumatic.     Nose: No congestion.  Eyes:     General: No scleral icterus.  Right eye: No discharge.        Left eye: No discharge.     Conjunctiva/sclera: Conjunctivae normal.  Cardiovascular:     Rate and Rhythm: Normal rate.  Pulmonary:     Effort: Pulmonary effort is normal.  Genitourinary:    Comments: With chaperone present, buttocks was visualized, there is no noted edema or erythema, no discharge or drainage present.  Area was palpated on the right buttocks there was induration with fluctuance present, it was tender to palpation.  Musculoskeletal:     Cervical back: Neck supple.     Right lower leg: No edema.     Left lower leg: No edema.  Skin:    General: Skin is warm and dry.     Coloration: Skin is not jaundiced or pale.  Neurological:     Mental  Status: She is alert and oriented to person, place, and time.  Psychiatric:        Mood and Affect: Mood normal.     ED Results / Procedures / Treatments   Labs (all labs ordered are listed, but only abnormal results are displayed) Labs Reviewed - No data to display  EKG None  Radiology No results found.  Procedures Procedures   Medications Ordered in ED Medications  ketorolac (TORADOL) injection 20 mg (20 mg Intramuscular Given 05/17/20 1815)    ED Course  I have reviewed the triage vital signs and the nursing notes.  Pertinent labs & imaging results that were available during my care of the patient were reviewed by me and considered in my medical decision making (see chart for details).    MDM Rules/Calculators/A&P                          Initial impression-patient presents with abscess on her right buttocks.  She is alert, does not appear to be in acute distress, vital signs reassuring.  Work-up-due to well-appearing patient, benign physical exam, further lab or imaging not warranted at this time.  Rule out-I have low suspicion for cellulitis or deep tissue infection as there is no surrounding erythema or edema, drainage or discharge present.  Patient has noted induration without fluctuance, will defer I&D at this time as there is no palpable fluctuance and I feel this would create more harm than benefit.   Plan- I suspect patient may have the start of an abscess that is not amenable to drainage.  Will recommend antibiotics at this time and have her follow-up in 3 to 4 days if symptoms have not fully resolved for possible drainage.  Vital signs have remained stable, no indication for hospital admission. Patient given at home care as well strict return precautions.  Patient verbalized that they understood agreed to said plan.   Final Clinical Impression(s) / ED Diagnoses Final diagnoses:  Abscess of buttock, right    Rx / DC Orders ED Discharge Orders          Ordered    doxycycline (VIBRAMYCIN) 100 MG capsule  2 times daily        05/17/20 1750           Barnie Del 05/17/20 1842    Vanetta Mulders, MD 05/21/20 [phone]

## 2020-05-17 NOTE — ED Triage Notes (Signed)
Pt to er, pt states that she is here for an abscess on her R buttock, states that she has had it for the past three days and the pain keeps getting worse.

## 2020-05-17 NOTE — Discharge Instructions (Addendum)
You have a abscess noted on your buttocks, it is not amenable for drainage at this time.  I have started you on antibiotics please take as prescribed.  I would like you to apply warm compresses to the area do this 3 times a day as this will help bring infection to head and hopefully drain on its own.  You may take over-the-counter pain medications like ibuprofen and/or Tylenol every 6 hours as needed please follow dosing the back of bottle.  I would like to come back in 3 days times if symptoms have not resolved or if they get worse.  this means you may need to have it drained.  Come back to the emergency department if you develop chest pain, shortness of breath, severe abdominal pain, uncontrolled nausea, vomiting, diarrhea.
